# Patient Record
Sex: Female | Born: 1995 | Race: Black or African American | Hispanic: No | Marital: Single | Smoking: Never smoker
Health system: Southern US, Community
[De-identification: ages and names within clinical notes are randomized; demographics above are authoritative.]

## PROBLEM LIST (undated history)

## (undated) DIAGNOSIS — J45909 Unspecified asthma, uncomplicated: Secondary | ICD-10-CM

## (undated) HISTORY — PX: OTHER SURGICAL HISTORY: SHX169

## (undated) HISTORY — PX: HERNIA REPAIR: SHX51

---
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
## 2017-01-12 DIAGNOSIS — K921 Melena: Secondary | ICD-10-CM | POA: Diagnosis not present

## 2017-01-21 DIAGNOSIS — R1084 Generalized abdominal pain: Secondary | ICD-10-CM | POA: Diagnosis not present

## 2017-01-21 DIAGNOSIS — R109 Unspecified abdominal pain: Secondary | ICD-10-CM | POA: Diagnosis not present

## 2017-01-21 DIAGNOSIS — R110 Nausea: Secondary | ICD-10-CM | POA: Diagnosis not present

## 2017-02-16 DIAGNOSIS — S060X0A Concussion without loss of consciousness, initial encounter: Secondary | ICD-10-CM | POA: Diagnosis not present

## 2017-02-16 DIAGNOSIS — R29898 Other symptoms and signs involving the musculoskeletal system: Secondary | ICD-10-CM | POA: Diagnosis not present

## 2017-02-19 DIAGNOSIS — S060X0D Concussion without loss of consciousness, subsequent encounter: Secondary | ICD-10-CM | POA: Diagnosis not present

## 2017-02-22 DIAGNOSIS — Z8379 Family history of other diseases of the digestive system: Secondary | ICD-10-CM | POA: Diagnosis not present

## 2017-02-22 DIAGNOSIS — R1084 Generalized abdominal pain: Secondary | ICD-10-CM | POA: Diagnosis not present

## 2017-02-22 DIAGNOSIS — K921 Melena: Secondary | ICD-10-CM | POA: Diagnosis not present

## 2017-05-15 DIAGNOSIS — W01198A Fall on same level from slipping, tripping and stumbling with subsequent striking against other object, initial encounter: Secondary | ICD-10-CM | POA: Diagnosis not present

## 2017-05-15 DIAGNOSIS — M25561 Pain in right knee: Secondary | ICD-10-CM | POA: Diagnosis not present

## 2017-05-15 DIAGNOSIS — J45909 Unspecified asthma, uncomplicated: Secondary | ICD-10-CM | POA: Diagnosis not present

## 2017-05-15 DIAGNOSIS — S8391XA Sprain of unspecified site of right knee, initial encounter: Secondary | ICD-10-CM | POA: Diagnosis not present

## 2017-06-15 DIAGNOSIS — R197 Diarrhea, unspecified: Secondary | ICD-10-CM | POA: Diagnosis not present

## 2017-06-15 DIAGNOSIS — R110 Nausea: Secondary | ICD-10-CM | POA: Diagnosis not present

## 2017-06-15 DIAGNOSIS — R51 Headache: Secondary | ICD-10-CM | POA: Diagnosis not present

## 2017-06-16 DIAGNOSIS — Z1389 Encounter for screening for other disorder: Secondary | ICD-10-CM | POA: Diagnosis not present

## 2017-06-16 DIAGNOSIS — Z713 Dietary counseling and surveillance: Secondary | ICD-10-CM | POA: Diagnosis not present

## 2017-06-16 DIAGNOSIS — Z23 Encounter for immunization: Secondary | ICD-10-CM | POA: Diagnosis not present

## 2017-06-16 DIAGNOSIS — Z0001 Encounter for general adult medical examination with abnormal findings: Secondary | ICD-10-CM | POA: Diagnosis not present

## 2017-08-02 IMAGING — CT CT ABD-PELV W/ CM
2 of 4 series · 17 of 46 positions shown, 19 images · IV contrast (APPLIED)
Comparison: Abdominal radiograph dated 03/07/2016.

CLINICAL DATA: 20-year-old female with abdominal pain.

EXAM:
CT ABDOMEN AND PELVIS WITH CONTRAST
TECHNIQUE: Multidetector CT imaging of the abdomen and pelvis was performed
using the standard protocol following bolus administration of
intravenous contrast.
CONTRAST:  100mL Y1TA0F-V77 IOPAMIDOL (Y1TA0F-V77) INJECTION 61%

[Series 2: axial st · axial · 0.68mm/px · z∈[-488,-103]mm · 14 of 85 slices shown, 16 images]
[im 4/85  soft-tissue]
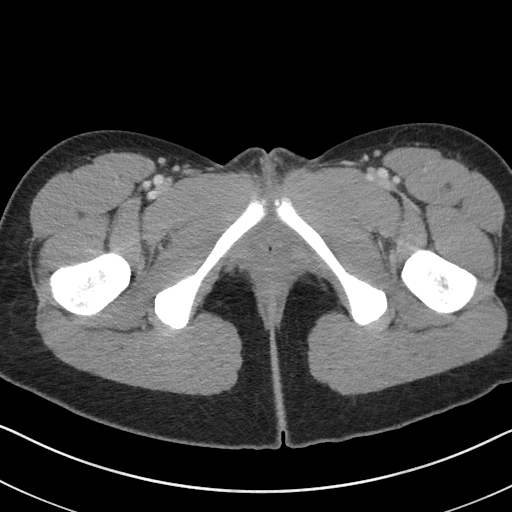
[im 4/85  bone]
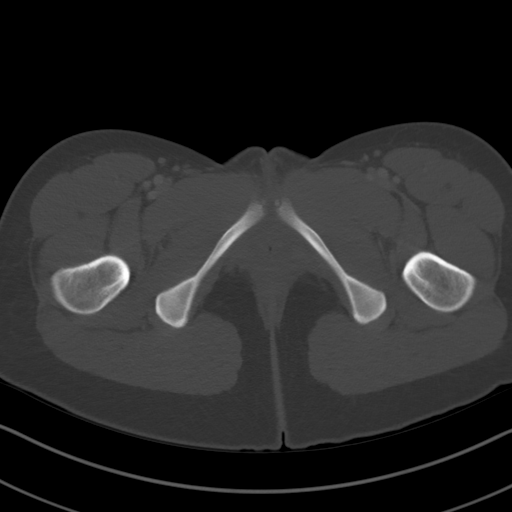
[im 11/85  soft-tissue]
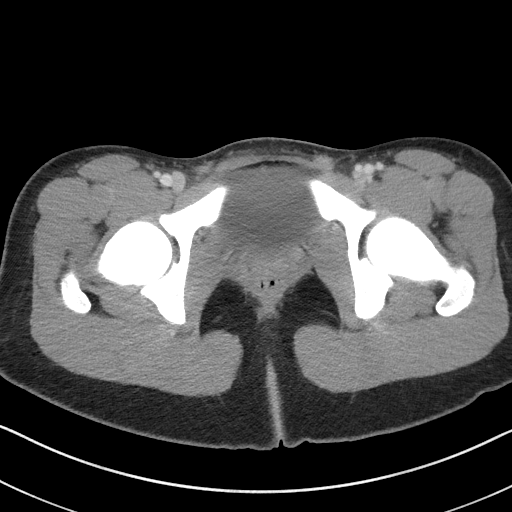
[im 18/85  soft-tissue]
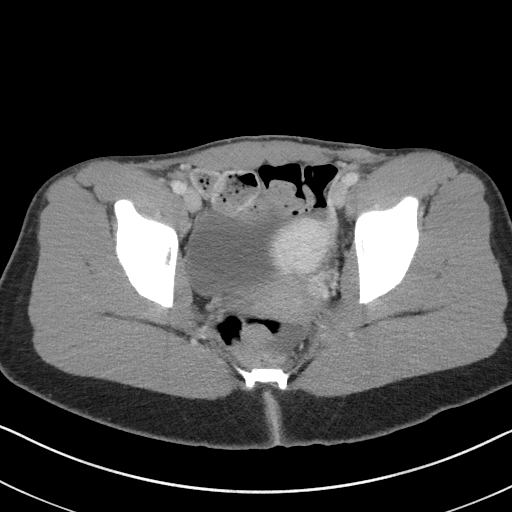
[im 22/85  soft-tissue]
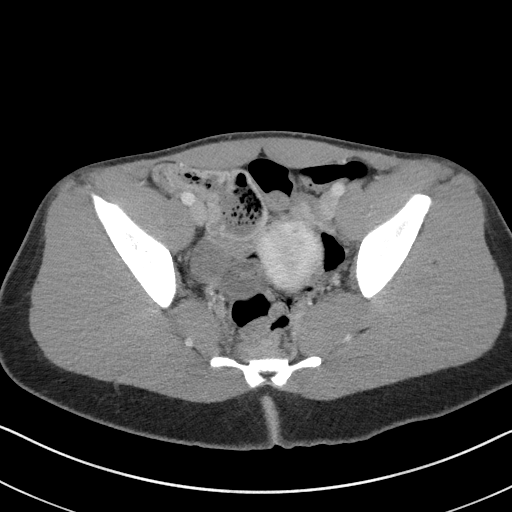
[im 29/85  soft-tissue]
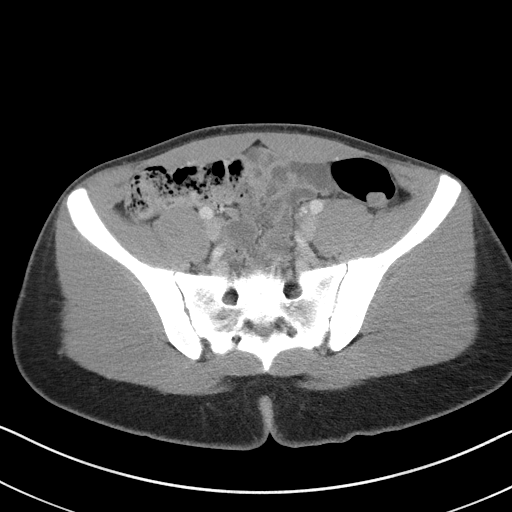
[im 36/85  soft-tissue]
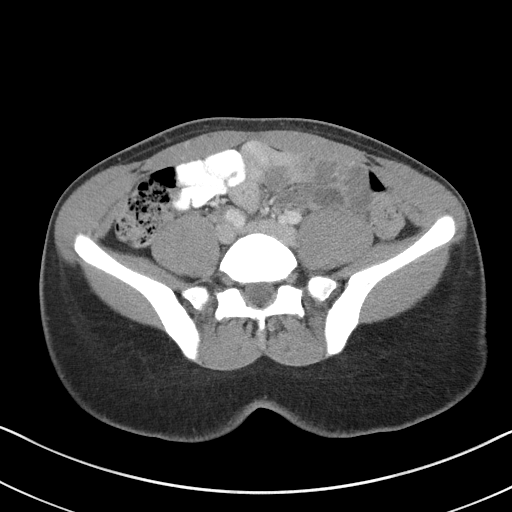
[im 39/85  soft-tissue]
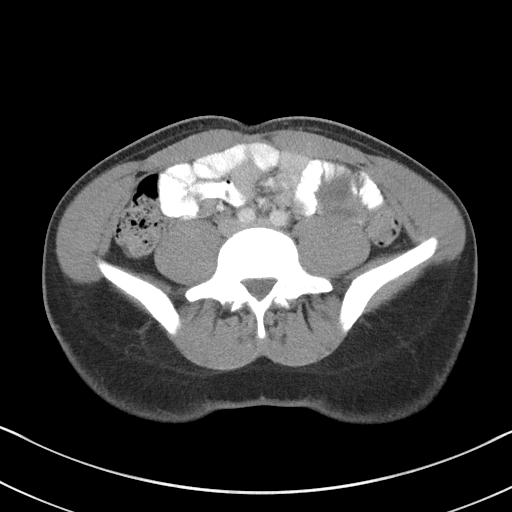
[im 46/85  soft-tissue]
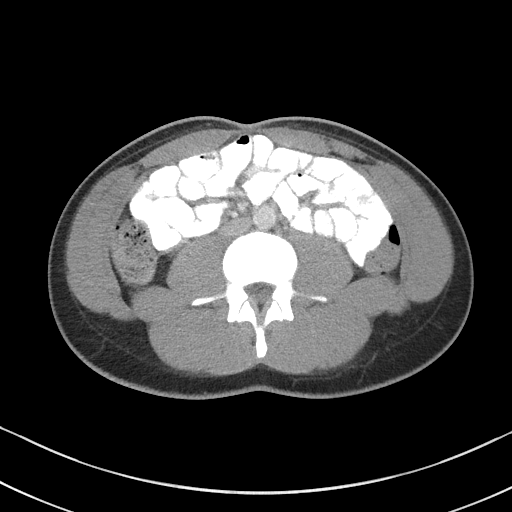
[im 50/85  soft-tissue]
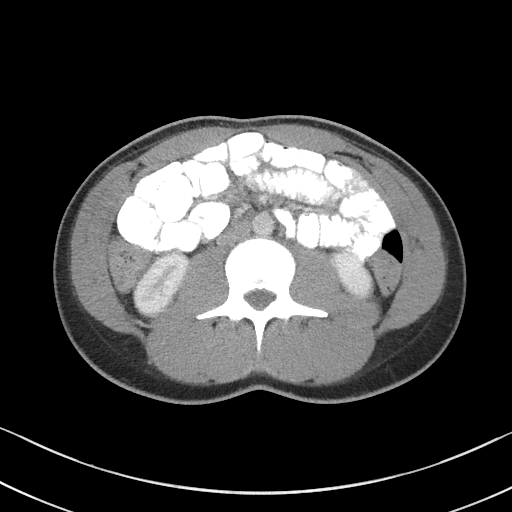
[im 50/85  bone]
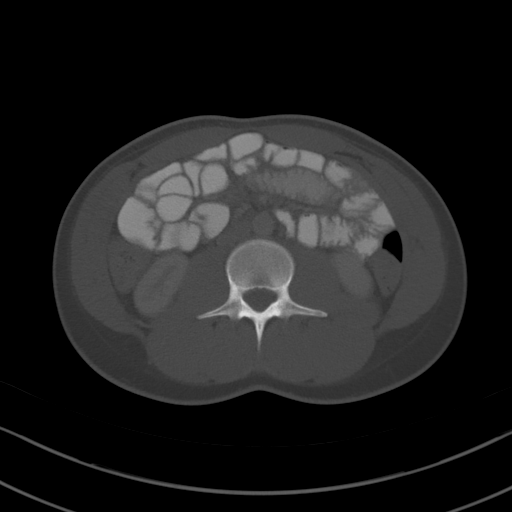
[im 57/85  soft-tissue]
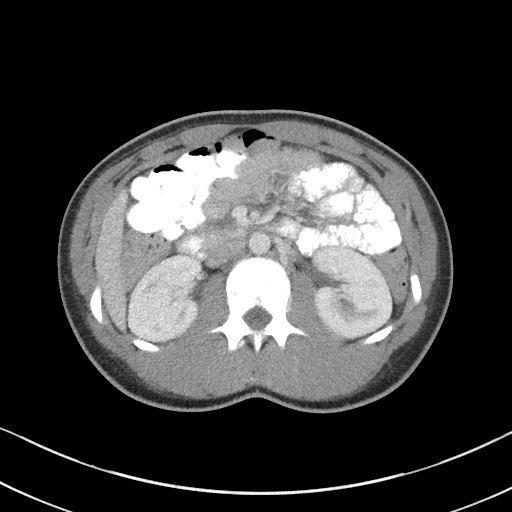
[im 64/85  soft-tissue]
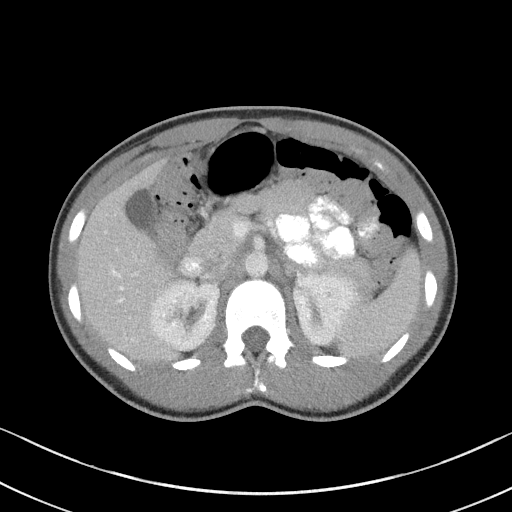
[im 67/85  soft-tissue]
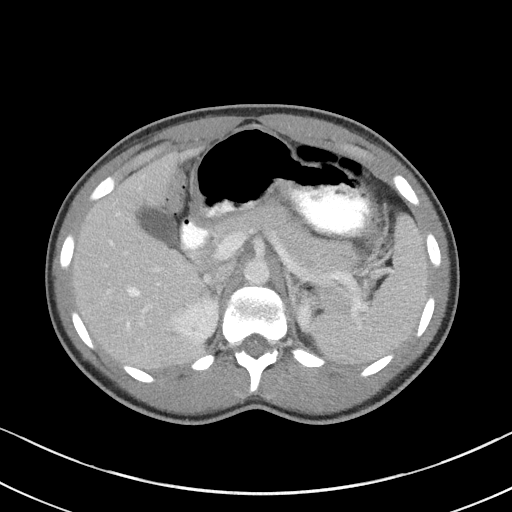
[im 74/85  soft-tissue]
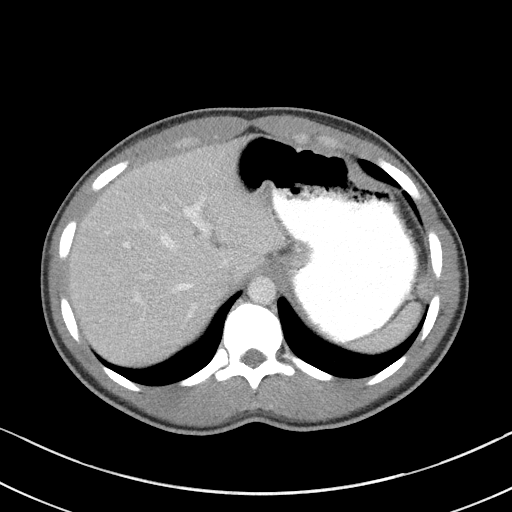
[im 81/85  soft-tissue]
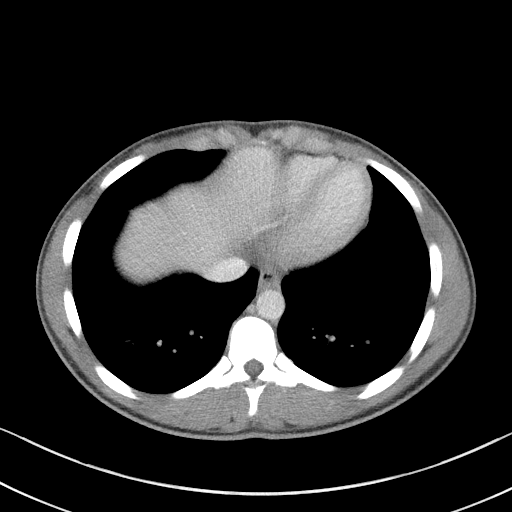

[Series 4: coronal st · coronal · 0.74mm/px · 3 of 68 slices shown]
[im 23/68  soft-tissue]
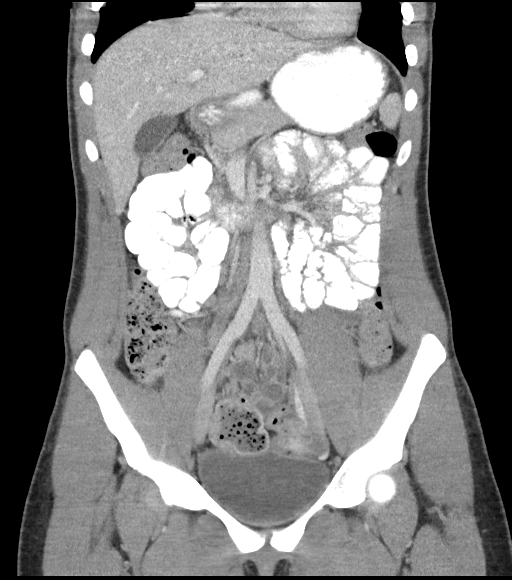
[im 30/68  soft-tissue]
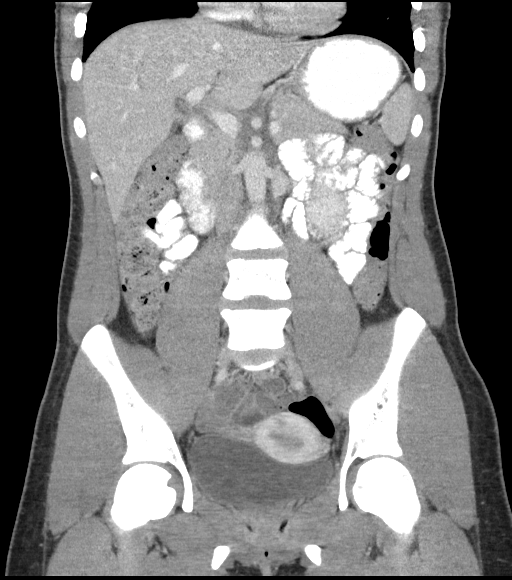
[im 38/68  soft-tissue]
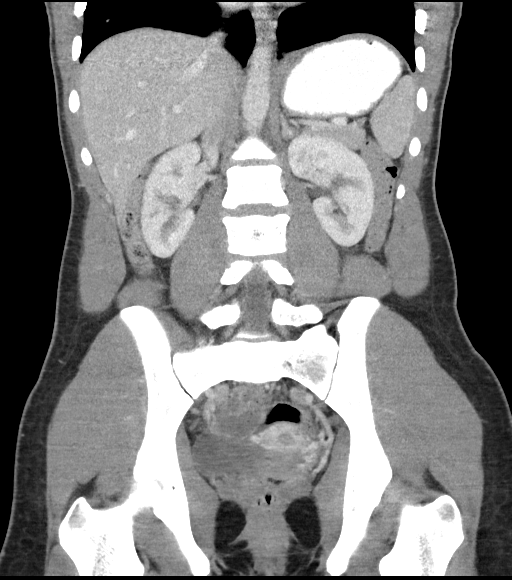

[17 of 46 positions shown; findings below may reference images not displayed]

FINDINGS: Lower chest: The visualized lung bases are clear.

No intra-abdominal free air.  Small free fluid within the pelvis.

Hepatobiliary: No focal liver abnormality is seen. No gallstones,
gallbladder wall thickening, or biliary dilatation.

Pancreas: Unremarkable. No pancreatic ductal dilatation or
surrounding inflammatory changes.

Spleen: Normal in size without focal abnormality.

Adrenals/Urinary Tract: Adrenal glands are unremarkable. Kidneys are
normal, without renal calculi, focal lesion, or hydronephrosis.
Bladder is unremarkable.

Stomach/Bowel: There is moderate stool throughout the colon. No
evidence of bowel obstruction or active inflammation. Normal
appendix (coronal series 4, image 22).

Vascular/Lymphatic: No significant vascular findings are present. No
enlarged abdominal or pelvic lymph nodes.

Reproductive: The uterus is anteverted and grossly unremarkable. The
visualized ovaries appear unremarkable as well.

Other: None

Musculoskeletal: No acute or significant osseous findings.
IMPRESSION: No acute intra-abdominal pelvic pathology.  Normal appendix.

## 2017-08-24 DIAGNOSIS — J4520 Mild intermittent asthma, uncomplicated: Secondary | ICD-10-CM | POA: Diagnosis not present

## 2017-08-24 DIAGNOSIS — R079 Chest pain, unspecified: Secondary | ICD-10-CM | POA: Diagnosis not present

## 2017-08-24 DIAGNOSIS — R0789 Other chest pain: Secondary | ICD-10-CM | POA: Diagnosis not present

## 2017-09-06 DIAGNOSIS — J301 Allergic rhinitis due to pollen: Secondary | ICD-10-CM | POA: Diagnosis not present

## 2017-09-06 DIAGNOSIS — J3081 Allergic rhinitis due to animal (cat) (dog) hair and dander: Secondary | ICD-10-CM | POA: Diagnosis not present

## 2017-09-06 DIAGNOSIS — J3089 Other allergic rhinitis: Secondary | ICD-10-CM | POA: Diagnosis not present

## 2017-09-06 DIAGNOSIS — J4530 Mild persistent asthma, uncomplicated: Secondary | ICD-10-CM | POA: Diagnosis not present

## 2017-09-08 DIAGNOSIS — I368 Other nonrheumatic tricuspid valve disorders: Secondary | ICD-10-CM | POA: Diagnosis not present

## 2017-09-08 DIAGNOSIS — S93402A Sprain of unspecified ligament of left ankle, initial encounter: Secondary | ICD-10-CM | POA: Diagnosis not present

## 2017-09-08 DIAGNOSIS — R079 Chest pain, unspecified: Secondary | ICD-10-CM | POA: Diagnosis not present

## 2017-09-08 DIAGNOSIS — I2510 Atherosclerotic heart disease of native coronary artery without angina pectoris: Secondary | ICD-10-CM | POA: Diagnosis not present

## 2017-09-13 DIAGNOSIS — R079 Chest pain, unspecified: Secondary | ICD-10-CM | POA: Diagnosis not present

## 2017-09-13 DIAGNOSIS — R030 Elevated blood-pressure reading, without diagnosis of hypertension: Secondary | ICD-10-CM | POA: Diagnosis not present

## 2017-09-13 DIAGNOSIS — I509 Heart failure, unspecified: Secondary | ICD-10-CM | POA: Diagnosis not present

## 2017-09-14 DIAGNOSIS — R079 Chest pain, unspecified: Secondary | ICD-10-CM | POA: Diagnosis not present

## 2017-09-14 DIAGNOSIS — I509 Heart failure, unspecified: Secondary | ICD-10-CM | POA: Diagnosis not present

## 2017-09-14 DIAGNOSIS — R030 Elevated blood-pressure reading, without diagnosis of hypertension: Secondary | ICD-10-CM | POA: Diagnosis not present

## 2017-10-23 DIAGNOSIS — M542 Cervicalgia: Secondary | ICD-10-CM | POA: Diagnosis not present

## 2017-10-23 DIAGNOSIS — S161XXA Strain of muscle, fascia and tendon at neck level, initial encounter: Secondary | ICD-10-CM | POA: Diagnosis not present

## 2017-10-23 DIAGNOSIS — S199XXA Unspecified injury of neck, initial encounter: Secondary | ICD-10-CM | POA: Diagnosis not present

## 2017-12-13 DIAGNOSIS — J020 Streptococcal pharyngitis: Secondary | ICD-10-CM | POA: Diagnosis not present

## 2017-12-13 DIAGNOSIS — B349 Viral infection, unspecified: Secondary | ICD-10-CM | POA: Diagnosis not present

## 2017-12-13 DIAGNOSIS — R51 Headache: Secondary | ICD-10-CM | POA: Diagnosis not present

## 2017-12-27 DIAGNOSIS — R05 Cough: Secondary | ICD-10-CM | POA: Diagnosis not present

## 2017-12-27 DIAGNOSIS — J45909 Unspecified asthma, uncomplicated: Secondary | ICD-10-CM | POA: Diagnosis not present

## 2017-12-27 DIAGNOSIS — J0100 Acute maxillary sinusitis, unspecified: Secondary | ICD-10-CM | POA: Diagnosis not present

## 2018-05-18 DIAGNOSIS — G4700 Insomnia, unspecified: Secondary | ICD-10-CM | POA: Diagnosis not present

## 2018-05-18 DIAGNOSIS — G4709 Other insomnia: Secondary | ICD-10-CM | POA: Diagnosis not present

## 2018-05-18 DIAGNOSIS — M940 Chondrocostal junction syndrome [Tietze]: Secondary | ICD-10-CM | POA: Diagnosis not present

## 2018-06-23 DIAGNOSIS — R110 Nausea: Secondary | ICD-10-CM | POA: Diagnosis not present

## 2018-06-23 DIAGNOSIS — R1012 Left upper quadrant pain: Secondary | ICD-10-CM | POA: Diagnosis not present

## 2018-06-23 DIAGNOSIS — K5900 Constipation, unspecified: Secondary | ICD-10-CM | POA: Diagnosis not present

## 2018-12-14 DIAGNOSIS — N83201 Unspecified ovarian cyst, right side: Secondary | ICD-10-CM | POA: Diagnosis not present

## 2018-12-14 DIAGNOSIS — R1084 Generalized abdominal pain: Secondary | ICD-10-CM | POA: Diagnosis not present

## 2018-12-14 DIAGNOSIS — Z87891 Personal history of nicotine dependence: Secondary | ICD-10-CM | POA: Diagnosis not present
